# Patient Record
Sex: Female | Born: 1945 | Race: White | Hispanic: No | Smoking: Never smoker
Health system: Southern US, Community
[De-identification: ages and names within clinical notes are randomized; demographics above are authoritative.]

## PROBLEM LIST (undated history)

## (undated) DIAGNOSIS — K219 Gastro-esophageal reflux disease without esophagitis: Secondary | ICD-10-CM

## (undated) DIAGNOSIS — T7840XA Allergy, unspecified, initial encounter: Secondary | ICD-10-CM

## (undated) DIAGNOSIS — R011 Cardiac murmur, unspecified: Secondary | ICD-10-CM

## (undated) DIAGNOSIS — E538 Deficiency of other specified B group vitamins: Secondary | ICD-10-CM

## (undated) DIAGNOSIS — M1990 Unspecified osteoarthritis, unspecified site: Secondary | ICD-10-CM

## (undated) DIAGNOSIS — T4145XA Adverse effect of unspecified anesthetic, initial encounter: Secondary | ICD-10-CM

## (undated) DIAGNOSIS — F419 Anxiety disorder, unspecified: Secondary | ICD-10-CM

## (undated) DIAGNOSIS — M109 Gout, unspecified: Secondary | ICD-10-CM

## (undated) DIAGNOSIS — M545 Low back pain, unspecified: Secondary | ICD-10-CM

## (undated) DIAGNOSIS — T8859XA Other complications of anesthesia, initial encounter: Secondary | ICD-10-CM

## (undated) DIAGNOSIS — G8929 Other chronic pain: Secondary | ICD-10-CM

## (undated) DIAGNOSIS — I10 Essential (primary) hypertension: Secondary | ICD-10-CM

## (undated) DIAGNOSIS — N189 Chronic kidney disease, unspecified: Secondary | ICD-10-CM

## (undated) HISTORY — PX: APPENDECTOMY: SHX54

## (undated) HISTORY — PX: COLONOSCOPY: SHX174

---
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
## 1997-11-07 ENCOUNTER — Other Ambulatory Visit: Admission: RE | Admit: 1997-11-07 | Discharge: 1997-11-07 | Payer: Self-pay | Admitting: Gynecology

## 1998-12-01 ENCOUNTER — Other Ambulatory Visit: Admission: RE | Admit: 1998-12-01 | Discharge: 1998-12-01 | Payer: Self-pay | Admitting: Gynecology

## 1999-05-01 ENCOUNTER — Encounter: Admission: RE | Admit: 1999-05-01 | Discharge: 1999-05-01 | Payer: Self-pay | Admitting: Gynecology

## 1999-05-01 ENCOUNTER — Encounter: Payer: Self-pay | Admitting: Gynecology

## 1999-11-06 ENCOUNTER — Other Ambulatory Visit: Admission: RE | Admit: 1999-11-06 | Discharge: 1999-11-06 | Payer: Self-pay | Admitting: Gynecology

## 1999-11-08 ENCOUNTER — Other Ambulatory Visit: Admission: RE | Admit: 1999-11-08 | Discharge: 1999-11-08 | Payer: Self-pay | Admitting: Gynecology

## 2000-03-06 ENCOUNTER — Ambulatory Visit (HOSPITAL_COMMUNITY): Admission: RE | Admit: 2000-03-06 | Discharge: 2000-03-06 | Payer: Self-pay | Admitting: Gastroenterology

## 2000-03-07 ENCOUNTER — Emergency Department (HOSPITAL_COMMUNITY): Admission: EM | Admit: 2000-03-07 | Discharge: 2000-03-07 | Payer: Self-pay | Admitting: Emergency Medicine

## 2000-03-07 ENCOUNTER — Encounter: Payer: Self-pay | Admitting: Emergency Medicine

## 2000-11-04 ENCOUNTER — Encounter: Admission: RE | Admit: 2000-11-04 | Discharge: 2000-11-04 | Payer: Self-pay | Admitting: Family Medicine

## 2000-11-04 ENCOUNTER — Encounter: Payer: Self-pay | Admitting: Family Medicine

## 2000-11-11 ENCOUNTER — Other Ambulatory Visit: Admission: RE | Admit: 2000-11-11 | Discharge: 2000-11-11 | Payer: Self-pay | Admitting: Urology

## 2001-11-05 ENCOUNTER — Encounter: Admission: RE | Admit: 2001-11-05 | Discharge: 2001-11-05 | Payer: Self-pay | Admitting: Family Medicine

## 2001-11-05 ENCOUNTER — Encounter: Payer: Self-pay | Admitting: Family Medicine

## 2002-11-15 ENCOUNTER — Encounter: Payer: Self-pay | Admitting: Family Medicine

## 2002-11-15 ENCOUNTER — Encounter: Admission: RE | Admit: 2002-11-15 | Discharge: 2002-11-15 | Payer: Self-pay | Admitting: Family Medicine

## 2003-05-18 ENCOUNTER — Other Ambulatory Visit: Admission: RE | Admit: 2003-05-18 | Discharge: 2003-05-18 | Payer: Self-pay | Admitting: Family Medicine

## 2003-06-23 ENCOUNTER — Encounter: Admission: RE | Admit: 2003-06-23 | Discharge: 2003-06-23 | Payer: Self-pay | Admitting: Family Medicine

## 2003-11-18 ENCOUNTER — Encounter: Admission: RE | Admit: 2003-11-18 | Discharge: 2003-11-18 | Payer: Self-pay | Admitting: Family Medicine

## 2004-05-18 ENCOUNTER — Other Ambulatory Visit: Admission: RE | Admit: 2004-05-18 | Discharge: 2004-05-18 | Payer: Self-pay | Admitting: Family Medicine

## 2004-11-30 ENCOUNTER — Encounter: Admission: RE | Admit: 2004-11-30 | Discharge: 2004-11-30 | Payer: Self-pay | Admitting: Family Medicine

## 2005-05-20 ENCOUNTER — Other Ambulatory Visit: Admission: RE | Admit: 2005-05-20 | Discharge: 2005-05-20 | Payer: Self-pay | Admitting: Family Medicine

## 2005-12-02 ENCOUNTER — Encounter: Admission: RE | Admit: 2005-12-02 | Discharge: 2005-12-02 | Payer: Self-pay | Admitting: Family Medicine

## 2006-08-18 ENCOUNTER — Other Ambulatory Visit: Admission: RE | Admit: 2006-08-18 | Discharge: 2006-08-18 | Payer: Self-pay | Admitting: Family Medicine

## 2006-12-04 ENCOUNTER — Encounter: Admission: RE | Admit: 2006-12-04 | Discharge: 2006-12-04 | Payer: Self-pay | Admitting: Family Medicine

## 2007-08-20 ENCOUNTER — Other Ambulatory Visit: Admission: RE | Admit: 2007-08-20 | Discharge: 2007-08-20 | Payer: Self-pay | Admitting: Family Medicine

## 2007-12-07 ENCOUNTER — Encounter: Admission: RE | Admit: 2007-12-07 | Discharge: 2007-12-07 | Payer: Self-pay | Admitting: Family Medicine

## 2010-09-14 NOTE — Op Note (Signed)
[facility]  Patient:    Tricia Price                          MRN: [id number] Proc. Date: 03/06/00 Adm. Date:  [id number] Attending:  Orland Mustard CC:         Tera Mater. Evlyn Kanner, M.D.  Leatha Gilding. Mezer, M.D.   Operative Report  PROCEDURE:  Colonoscopy.  GASTROENTEROLOGIST:  Llana Aliment. Edwards, M.D.  MEDICATIONS:  Fentanyl 100 mcg, Versed 7 mg IV.  INDICATIONS:  Iron-deficiency anemia.  INSTRUMENT:  Pediatric Olympus video colonoscopy.  DESCRIPTION OF PROCEDURE:  The procedure had been explained to the patient and consent obtained.  With the patient in the left lateral decubitus position, the Olympus pediatric video colonoscope was inserted following digital rectal exam and advanced under direct visualization.  The prep was excellent, and we were able to advance to the cecum identified by identification of the ileocecal valve and appendiceal orifice.  The scope with withdrawn. The cecum, ascending colon, hepatic flexure, transverse colon, splenic flexure, descending and sigmoid were seen well upon removal.  No polyps and no significant diverticular disease, and no other significant findings.  The scope was withdrawn.  The patient tolerated the procedure well.  She was maintained on low-flow oxygen on pulse oximetry throughout the procedure with no obvious problem.  ASSESSMENT: Iron-deficiency anemia without obvious cause on colonoscopy.  PLAN:  Will continue iron replacement and see her back in our office in six to eight weeks and recheck her stool. DD:  03/06/00 TD:  03/06/00 Job: [id number]

## 2010-10-15 ENCOUNTER — Ambulatory Visit: Payer: Self-pay | Admitting: Family Medicine

## 2013-12-17 ENCOUNTER — Ambulatory Visit: Payer: Self-pay | Admitting: Physician Assistant

## 2017-11-28 ENCOUNTER — Ambulatory Visit
Admission: RE | Admit: 2017-11-28 | Discharge: 2017-11-28 | Disposition: A | Discharge status: Home / Self Care | Payer: Medicare Other | Source: Ambulatory Visit | Attending: Physician Assistant | Admitting: Physician Assistant

## 2017-11-28 ENCOUNTER — Other Ambulatory Visit (INDEPENDENT_AMBULATORY_CARE_PROVIDER_SITE_OTHER): Payer: Self-pay | Admitting: Physician Assistant

## 2017-11-28 DIAGNOSIS — M545 Low back pain: Secondary | ICD-10-CM

## 2017-11-28 DIAGNOSIS — M25562 Pain in left knee: Secondary | ICD-10-CM

## 2019-06-08 ENCOUNTER — Encounter: Payer: Self-pay | Admitting: Ophthalmology

## 2019-06-10 ENCOUNTER — Other Ambulatory Visit: Payer: Self-pay

## 2019-06-10 ENCOUNTER — Other Ambulatory Visit
Admission: RE | Admit: 2019-06-10 | Discharge: 2019-06-10 | Disposition: A | Discharge status: Home / Self Care | Payer: Medicare Other | Source: Ambulatory Visit | Attending: Ophthalmology | Admitting: Ophthalmology

## 2019-06-10 DIAGNOSIS — Z20822 Contact with and (suspected) exposure to covid-19: Secondary | ICD-10-CM | POA: Insufficient documentation

## 2019-06-10 DIAGNOSIS — Z01812 Encounter for preprocedural laboratory examination: Secondary | ICD-10-CM | POA: Insufficient documentation

## 2019-06-10 LAB — SARS CORONAVIRUS 2 (TAT 6-24 HRS): SARS Coronavirus 2: NEGATIVE

## 2019-06-10 NOTE — Discharge Instructions (Signed)

## 2019-06-14 ENCOUNTER — Ambulatory Visit: Payer: Medicare Other | Admitting: Anesthesiology

## 2019-06-14 ENCOUNTER — Other Ambulatory Visit: Payer: Self-pay

## 2019-06-14 ENCOUNTER — Ambulatory Visit
Admission: RE | Admit: 2019-06-14 | Discharge: 2019-06-14 | Disposition: A | Discharge status: Home / Self Care | Payer: Medicare Other | Source: Ambulatory Visit | Attending: Ophthalmology | Admitting: Ophthalmology

## 2019-06-14 ENCOUNTER — Encounter: Payer: Self-pay | Admitting: Ophthalmology

## 2019-06-14 ENCOUNTER — Encounter
Admission: RE | Disposition: A | Discharge status: Home / Self Care | Payer: Self-pay | Source: Ambulatory Visit | Attending: Ophthalmology

## 2019-06-14 DIAGNOSIS — I10 Essential (primary) hypertension: Secondary | ICD-10-CM | POA: Diagnosis not present

## 2019-06-14 DIAGNOSIS — Z79899 Other long term (current) drug therapy: Secondary | ICD-10-CM | POA: Insufficient documentation

## 2019-06-14 DIAGNOSIS — H2512 Age-related nuclear cataract, left eye: Secondary | ICD-10-CM | POA: Insufficient documentation

## 2019-06-14 HISTORY — DX: Other complications of anesthesia, initial encounter: T88.59XA

## 2019-06-14 HISTORY — PX: CATARACT EXTRACTION W/PHACO: SHX586

## 2019-06-14 HISTORY — DX: Cardiac murmur, unspecified: R01.1

## 2019-06-14 HISTORY — DX: Allergy, unspecified, initial encounter: T78.40XA

## 2019-06-14 HISTORY — DX: Essential (primary) hypertension: I10

## 2019-06-14 HISTORY — DX: Chronic kidney disease, unspecified: N18.9

## 2019-06-14 HISTORY — DX: Unspecified osteoarthritis, unspecified site: M19.90

## 2019-06-14 HISTORY — DX: Gastro-esophageal reflux disease without esophagitis: K21.9

## 2019-06-14 SURGERY — PHACOEMULSIFICATION, CATARACT, WITH IOL INSERTION
Anesthesia: General | Site: Eye | Laterality: Left

## 2019-06-14 MED ORDER — ACETAMINOPHEN 325 MG PO TABS: 325.0000 mg | ORAL_TABLET | ORAL | Status: DC | PRN

## 2019-06-14 MED ORDER — EPINEPHRINE PF 1 MG/ML IJ SOLN
INTRAOCULAR | Status: DC | PRN
  Administered 2019-06-14: 95 mL via OPHTHALMIC

## 2019-06-14 MED ORDER — LIDOCAINE HCL (PF) 2 % IJ SOLN
INTRAOCULAR | Status: DC | PRN
  Administered 2019-06-14: 1 mL via INTRAOCULAR

## 2019-06-14 MED ORDER — SODIUM HYALURONATE 10 MG/ML IO SOLN
INTRAOCULAR | Status: DC | PRN
  Administered 2019-06-14: 0.55 mL via INTRAOCULAR

## 2019-06-14 MED ORDER — MOXIFLOXACIN HCL 0.5 % OP SOLN
OPHTHALMIC | Status: DC | PRN
  Administered 2019-06-14: 0.2 mL via OPHTHALMIC

## 2019-06-14 MED ORDER — FENTANYL CITRATE (PF) 100 MCG/2ML IJ SOLN
INTRAMUSCULAR | Status: DC | PRN
  Administered 2019-06-14 (×2): 50 ug via INTRAVENOUS

## 2019-06-14 MED ORDER — SODIUM HYALURONATE 23 MG/ML IO SOLN
INTRAOCULAR | Status: DC | PRN
  Administered 2019-06-14: 0.6 mL via INTRAOCULAR

## 2019-06-14 MED ORDER — ACETAMINOPHEN 160 MG/5ML PO SOLN: 325.0000 mg | ORAL | Status: DC | PRN

## 2019-06-14 MED ORDER — TETRACAINE HCL 0.5 % OP SOLN
1.0000 [drp] | OPHTHALMIC | Status: DC | PRN
  Administered 2019-06-14 (×3): 1 [drp] via OPHTHALMIC

## 2019-06-14 MED ORDER — ARMC OPHTHALMIC DILATING DROPS
1.0000 "application " | OPHTHALMIC | Status: DC | PRN
  Administered 2019-06-14 (×3): 1 via OPHTHALMIC

## 2019-06-14 MED ORDER — MIDAZOLAM HCL 2 MG/2ML IJ SOLN
INTRAMUSCULAR | Status: DC | PRN
  Administered 2019-06-14: 2 mg via INTRAVENOUS

## 2019-06-14 SURGICAL SUPPLY — 19 items
CANNULA ANT/CHMB 27G (MISCELLANEOUS) ×2 IMPLANT
CANNULA ANT/CHMB 27GA (MISCELLANEOUS) ×6 IMPLANT
DISSECTOR HYDRO NUCLEUS 50X22 (MISCELLANEOUS) ×3 IMPLANT
GLOVE SURG LX 7.5 STRW (GLOVE) ×4
GLOVE SURG LX STRL 7.5 STRW (GLOVE) ×1 IMPLANT
GLOVE SURG SYN 8.5  E (GLOVE) ×2
GLOVE SURG SYN 8.5 E (GLOVE) ×1 IMPLANT
GLOVE SURG SYN 8.5 PF PI (GLOVE) ×1 IMPLANT
GOWN STRL REUS W/ TWL LRG LVL3 (GOWN DISPOSABLE) ×2 IMPLANT
GOWN STRL REUS W/TWL LRG LVL3 (GOWN DISPOSABLE) ×4
LENS IOL TECNIS ITEC 19.0 (Intraocular Lens) ×2 IMPLANT
MARKER SKIN DUAL TIP RULER LAB (MISCELLANEOUS) ×3 IMPLANT
PACK DR. KING ARMS (PACKS) ×3 IMPLANT
PACK EYE AFTER SURG (MISCELLANEOUS) ×3 IMPLANT
PACK OPTHALMIC (MISCELLANEOUS) ×3 IMPLANT
SYR 3ML LL SCALE MARK (SYRINGE) ×3 IMPLANT
SYR TB 1ML LUER SLIP (SYRINGE) ×3 IMPLANT
WATER STERILE IRR 250ML POUR (IV SOLUTION) ×3 IMPLANT
WIPE NON LINTING 3.25X3.25 (MISCELLANEOUS) ×3 IMPLANT

## 2019-06-14 NOTE — Transfer of Care (Signed)
Immediate Anesthesia Transfer of Care Note  Patient: Tricia Price  Procedure(s) Performed: CATARACT EXTRACTION PHACO AND INTRAOCULAR LENS PLACEMENT (IOC) LEFT (Left Eye)  Patient Location: PACU  Anesthesia Type: General  Level of Consciousness: awake, alert  and patient cooperative  Airway and Oxygen Therapy: Patient Spontanous Breathing and Patient connected to supplemental oxygen  Post-op Assessment: Post-op Vital signs reviewed, Patient's Cardiovascular Status Stable, Respiratory Function Stable, Patent Airway and No signs of Nausea or vomiting  Post-op Vital Signs: Reviewed and stable  Complications: No apparent anesthesia complications

## 2019-06-14 NOTE — Anesthesia Procedure Notes (Signed)
Procedure Name: MAC Date/Time: 06/14/2019 10:23 AM Performed by: Jeannene Patella, CRNA Pre-anesthesia Checklist: Patient identified, Emergency Drugs available, Patient being monitored, Timeout performed and Suction available Patient Re-evaluated:Patient Re-evaluated prior to induction Oxygen Delivery Method: Nasal cannula

## 2019-06-14 NOTE — H&P (Signed)

## 2019-06-14 NOTE — Anesthesia Preprocedure Evaluation (Addendum)
Anesthesia Evaluation    Airway Mallampati: II  TM Distance: >3 FB Neck ROM: Full    Dental   Pulmonary    Pulmonary exam normal        Cardiovascular hypertension, Normal cardiovascular exam     Neuro/Psych    GI/Hepatic GERD  ,  Endo/Other    Renal/GU CRFRenal disease     Musculoskeletal  (+) Arthritis ,   Abdominal (+) + obese,   Peds  Hematology   Anesthesia Other Findings   Reproductive/Obstetrics                             Anesthesia Physical Anesthesia Plan  ASA: II  Anesthesia Plan: MAC   Post-op Pain Management:    Induction: Intravenous  PONV Risk Score and Plan:   Airway Management Planned: Natural Airway  Additional Equipment:   Intra-op Plan:   Post-operative Plan:   Informed Consent: I have reviewed the patients History and Physical, chart, labs and discussed the procedure including the risks, benefits and alternatives for the proposed anesthesia with the patient or authorized representative who has indicated his/her understanding and acceptance.       Plan Discussed with: CRNA, Anesthesiologist and Surgeon  Anesthesia Plan Comments:        Anesthesia Quick Evaluation

## 2019-06-14 NOTE — Op Note (Signed)
OPERATIVE NOTE  Tricia Price [id number] 06/14/2019   PREOPERATIVE DIAGNOSIS:  Nuclear sclerotic cataract left eye.  H25.12   POSTOPERATIVE DIAGNOSIS:    Nuclear sclerotic cataract left eye.     PROCEDURE:  Phacoemusification with posterior chamber intraocular lens placement of the left eye   LENS:   Implant Name Type Inv. Item Serial No. Manufacturer Lot No. LRB No. Used Action  LENS IOL DIOP 19.0 - [id number] Intraocular Lens LENS IOL DIOP 19.0 [id number] AMO  Left 1 Implanted      Procedure(s) with comments: CATARACT EXTRACTION PHACO AND INTRAOCULAR LENS PLACEMENT (IOC) LEFT (Left) - CDE 6.06 [place] 0:48.5  PCB00   ULTRASOUND TIME: 0 minutes 48 seconds.  CDE 6.06   SURGEON:  Willey Blade, MD, MPH   ANESTHESIA:  Topical with tetracaine drops augmented with 1% preservative-free intracameral lidocaine.  ESTIMATED BLOOD LOSS: <1 mL   COMPLICATIONS:  None.   DESCRIPTION OF PROCEDURE:  The patient was identified in the holding room and transported to the operating room and placed in the supine position under the operating microscope.  The left eye was identified as the operative eye and it was prepped and draped in the usual sterile ophthalmic fashion.   A 1.0 millimeter clear-corneal paracentesis was made at the 5:00 position. 0.5 ml of preservative-free 1% lidocaine with epinephrine was injected into the anterior chamber.  The anterior chamber was filled with Healon 5 viscoelastic.  A 2.4 millimeter keratome was used to make a near-clear corneal incision at the 2:00 position.  A curvilinear capsulorrhexis was made with a cystotome and capsulorrhexis forceps.  Balanced salt solution was used to hydrodissect and hydrodelineate the nucleus.   Phacoemulsification was then used in stop and chop fashion to remove the lens nucleus and epinucleus.  The remaining cortex was then removed using the irrigation and aspiration handpiece. Healon was then placed into the capsular bag to  distend it for lens placement.  A lens was then injected into the capsular bag.  The remaining viscoelastic was aspirated.   Wounds were hydrated with balanced salt solution.  The anterior chamber was inflated to a physiologic pressure with balanced salt solution.  Intracameral vigamox 0.1 mL undiltued was injected into the eye and a drop placed onto the ocular surface.  No wound leaks were noted.  The patient was taken to the recovery room in stable condition without complications of anesthesia or surgery  Willey Blade 06/14/2019, 10:43 AM

## 2019-06-14 NOTE — Anesthesia Postprocedure Evaluation (Signed)
Anesthesia Post Note  Patient: Tricia Price  Procedure(s) Performed: CATARACT EXTRACTION PHACO AND INTRAOCULAR LENS PLACEMENT (IOC) LEFT (Left Eye)     Patient location during evaluation: PACU Anesthesia Type: General Level of consciousness: awake and alert Pain management: pain level controlled Vital Signs Assessment: post-procedure vital signs reviewed and stable Respiratory status: spontaneous breathing, nonlabored ventilation, respiratory function stable and patient connected to nasal cannula oxygen Cardiovascular status: stable and blood pressure returned to baseline Postop Assessment: no apparent nausea or vomiting Anesthetic complications: no    Gayland Curry Andruw Battie

## 2019-06-15 ENCOUNTER — Encounter: Payer: Self-pay | Admitting: *Deleted

## 2019-08-05 ENCOUNTER — Other Ambulatory Visit: Payer: Self-pay | Admitting: Physical Medicine & Rehabilitation

## 2019-08-05 DIAGNOSIS — M5416 Radiculopathy, lumbar region: Secondary | ICD-10-CM

## 2019-08-19 ENCOUNTER — Ambulatory Visit
Admission: RE | Admit: 2019-08-19 | Discharge: 2019-08-19 | Disposition: A | Discharge status: Home / Self Care | Payer: Medicare Other | Source: Ambulatory Visit | Attending: Physical Medicine & Rehabilitation | Admitting: Physical Medicine & Rehabilitation

## 2019-08-19 ENCOUNTER — Other Ambulatory Visit: Payer: Self-pay

## 2019-08-19 DIAGNOSIS — M5416 Radiculopathy, lumbar region: Secondary | ICD-10-CM | POA: Diagnosis not present

## 2020-05-18 DIAGNOSIS — S065XAA Traumatic subdural hemorrhage with loss of consciousness status unknown, initial encounter: Secondary | ICD-10-CM

## 2020-05-18 HISTORY — DX: Traumatic subdural hemorrhage with loss of consciousness status unknown, initial encounter: S06.5XAA

## 2021-05-21 ENCOUNTER — Ambulatory Visit
Admission: RE | Admit: 2021-05-21 | Discharge: 2021-05-21 | Disposition: A | Discharge status: Home / Self Care | Payer: Medicare Other | Source: Ambulatory Visit | Attending: Family Medicine | Admitting: Family Medicine

## 2021-05-21 ENCOUNTER — Other Ambulatory Visit: Payer: Self-pay | Admitting: Family Medicine

## 2021-05-21 ENCOUNTER — Other Ambulatory Visit: Payer: Self-pay

## 2021-05-21 DIAGNOSIS — R2242 Localized swelling, mass and lump, left lower limb: Secondary | ICD-10-CM | POA: Diagnosis present

## 2021-05-21 DIAGNOSIS — M79605 Pain in left leg: Secondary | ICD-10-CM

## 2021-09-04 ENCOUNTER — Encounter: Payer: Self-pay | Admitting: Ophthalmology

## 2021-09-06 NOTE — Discharge Instructions (Signed)

## 2021-09-10 ENCOUNTER — Ambulatory Visit
Admission: RE | Admit: 2021-09-10 | Discharge: 2021-09-10 | Disposition: A | Discharge status: Home / Self Care | Payer: Medicare Other | Source: Ambulatory Visit | Attending: Ophthalmology | Admitting: Ophthalmology

## 2021-09-10 ENCOUNTER — Other Ambulatory Visit: Payer: Self-pay

## 2021-09-10 ENCOUNTER — Encounter: Payer: Self-pay | Admitting: Ophthalmology

## 2021-09-10 ENCOUNTER — Encounter
Admission: RE | Disposition: A | Discharge status: Home / Self Care | Payer: Self-pay | Source: Ambulatory Visit | Attending: Ophthalmology

## 2021-09-10 ENCOUNTER — Ambulatory Visit: Payer: Medicare Other | Admitting: Anesthesiology

## 2021-09-10 DIAGNOSIS — H2511 Age-related nuclear cataract, right eye: Secondary | ICD-10-CM | POA: Diagnosis present

## 2021-09-10 DIAGNOSIS — I129 Hypertensive chronic kidney disease with stage 1 through stage 4 chronic kidney disease, or unspecified chronic kidney disease: Secondary | ICD-10-CM | POA: Insufficient documentation

## 2021-09-10 DIAGNOSIS — E669 Obesity, unspecified: Secondary | ICD-10-CM | POA: Diagnosis not present

## 2021-09-10 DIAGNOSIS — N189 Chronic kidney disease, unspecified: Secondary | ICD-10-CM | POA: Diagnosis not present

## 2021-09-10 DIAGNOSIS — Z6830 Body mass index (BMI) 30.0-30.9, adult: Secondary | ICD-10-CM | POA: Diagnosis not present

## 2021-09-10 DIAGNOSIS — Z01818 Encounter for other preprocedural examination: Secondary | ICD-10-CM

## 2021-09-10 HISTORY — PX: CATARACT EXTRACTION W/PHACO: SHX586

## 2021-09-10 HISTORY — DX: Low back pain, unspecified: M54.50

## 2021-09-10 HISTORY — DX: Other chronic pain: G89.29

## 2021-09-10 SURGERY — PHACOEMULSIFICATION, CATARACT, WITH IOL INSERTION
Anesthesia: Monitor Anesthesia Care | Site: Eye | Laterality: Right

## 2021-09-10 MED ORDER — TETRACAINE HCL 0.5 % OP SOLN
1.0000 [drp] | OPHTHALMIC | Status: DC | PRN
  Administered 2021-09-10 (×3): 1 [drp] via OPHTHALMIC

## 2021-09-10 MED ORDER — SIGHTPATH DOSE#1 SODIUM HYALURONATE 23 MG/ML IO SOLUTION
PREFILLED_SYRINGE | INTRAOCULAR | Status: DC | PRN
  Administered 2021-09-10: 0.6 mL via INTRAOCULAR

## 2021-09-10 MED ORDER — SIGHTPATH DOSE#1 SODIUM HYALURONATE 10 MG/ML IO SOLUTION
PREFILLED_SYRINGE | INTRAOCULAR | Status: DC | PRN
  Administered 2021-09-10: 0.85 mL via INTRAOCULAR

## 2021-09-10 MED ORDER — LACTATED RINGERS IV SOLN: INTRAVENOUS | Status: DC

## 2021-09-10 MED ORDER — SIGHTPATH DOSE#1 BSS IO SOLN
INTRAOCULAR | Status: DC | PRN
  Administered 2021-09-10: 15 mL

## 2021-09-10 MED ORDER — ACETAMINOPHEN 160 MG/5ML PO SOLN: 325.0000 mg | ORAL | Status: DC | PRN

## 2021-09-10 MED ORDER — CYCLOPENTOLATE HCL 2 % OP SOLN
1.0000 [drp] | OPHTHALMIC | Status: AC | PRN
  Administered 2021-09-10 (×3): 1 [drp] via OPHTHALMIC

## 2021-09-10 MED ORDER — LIDOCAINE HCL (PF) 2 % IJ SOLN
INTRAMUSCULAR | Status: DC | PRN
  Administered 2021-09-10: 1 mL via INTRAOCULAR

## 2021-09-10 MED ORDER — MIDAZOLAM HCL 2 MG/2ML IJ SOLN
INTRAMUSCULAR | Status: DC | PRN
  Administered 2021-09-10 (×2): 1 mg via INTRAVENOUS

## 2021-09-10 MED ORDER — ACETAMINOPHEN 325 MG PO TABS: 650.0000 mg | ORAL_TABLET | ORAL | Status: DC | PRN

## 2021-09-10 MED ORDER — ONDANSETRON HCL 4 MG/2ML IJ SOLN: 4.0000 mg | Freq: Once | INTRAMUSCULAR | Status: DC | PRN

## 2021-09-10 MED ORDER — FENTANYL CITRATE (PF) 100 MCG/2ML IJ SOLN
INTRAMUSCULAR | Status: DC | PRN
  Administered 2021-09-10: 50 ug via INTRAVENOUS

## 2021-09-10 MED ORDER — PHENYLEPHRINE HCL 10 % OP SOLN
1.0000 [drp] | OPHTHALMIC | Status: AC | PRN
  Administered 2021-09-10 (×3): 1 [drp] via OPHTHALMIC

## 2021-09-10 MED ORDER — SIGHTPATH DOSE#1 BSS IO SOLN
INTRAOCULAR | Status: DC | PRN
  Administered 2021-09-10: 92 mL via OPHTHALMIC

## 2021-09-10 MED ORDER — MOXIFLOXACIN HCL 0.5 % OP SOLN
OPHTHALMIC | Status: DC | PRN
  Administered 2021-09-10: 0.2 mL via OPHTHALMIC

## 2021-09-10 SURGICAL SUPPLY — 14 items
CATARACT SUITE SIGHTPATH (MISCELLANEOUS) ×2 IMPLANT
DISSECTOR HYDRO NUCLEUS 50X22 (MISCELLANEOUS) ×2 IMPLANT
FEE CATARACT SUITE SIGHTPATH (MISCELLANEOUS) ×1 IMPLANT
GLOVE SURG GAMMEX PI TX LF 7.5 (GLOVE) ×2 IMPLANT
GLOVE SURG SYN 8.5  E (GLOVE) ×2
GLOVE SURG SYN 8.5 E (GLOVE) ×1 IMPLANT
GLOVE SURG SYN 8.5 PF PI (GLOVE) ×1 IMPLANT
LENS IOL TECNIS EYHANCE 18.0 (Intraocular Lens) ×1 IMPLANT
NDL FILTER BLUNT 18X1 1/2 (NEEDLE) ×1 IMPLANT
NEEDLE FILTER BLUNT 18X 1/2SAF (NEEDLE) ×1
NEEDLE FILTER BLUNT 18X1 1/2 (NEEDLE) ×1 IMPLANT
SYR 3ML LL SCALE MARK (SYRINGE) ×2 IMPLANT
SYR 5ML LL (SYRINGE) ×2 IMPLANT
WATER STERILE IRR 250ML POUR (IV SOLUTION) ×2 IMPLANT

## 2021-09-10 NOTE — Anesthesia Preprocedure Evaluation (Signed)
Anesthesia Evaluation  ?Patient identified by MRN, date of birth, ID band ?Patient awake ? ? ? ?Reviewed: ?Allergy & Precautions, NPO status , Patient's Chart, lab work & pertinent test results, reviewed documented beta blocker date and time  ? ?History of Anesthesia Complications ?Negative for: history of anesthetic complications ? ?Airway ?Mallampati: II ? ?TM Distance: >3 FB ?Neck ROM: Limited ? ? ? Dental ?  ?Pulmonary ? ?  ?breath sounds clear to auscultation ? ? ? ? ? ? Cardiovascular ?hypertension, (-) angina+ DOE (Chronic, stable)  ? ?Rhythm:Regular Rate:Normal ? ? ?  ?Neuro/Psych ? ?H/o subdural hematoma 04/2020 ?  ? GI/Hepatic ?GERD  ,  ?Endo/Other  ? ? Renal/GU ?CRFRenal disease  ? ?  ?Musculoskeletal ? ?(+) Arthritis ,  ? Abdominal ?(+) + obese (BMI 31),   ?Peds ? Hematology ?  ?Anesthesia Other Findings ? ? Reproductive/Obstetrics ? ?  ? ? ? ? ? ? ? ? ? ? ? ? ? ?  ?  ? ? ? ? ? ? ? ? ?Anesthesia Physical ?Anesthesia Plan ? ?ASA: 2 ? ?Anesthesia Plan: MAC  ? ?Post-op Pain Management:   ? ?Induction: Intravenous ? ?PONV Risk Score and Plan: TIVA, Midazolam and Treatment may vary due to age or medical condition ? ?Airway Management Planned: Nasal Cannula ? ?Additional Equipment:  ? ?Intra-op Plan:  ? ?Post-operative Plan:  ? ?Informed Consent: I have reviewed the patients History and Physical, chart, labs and discussed the procedure including the risks, benefits and alternatives for the proposed anesthesia with the patient or authorized representative who has indicated his/her understanding and acceptance.  ? ? ? ? ? ?Plan Discussed with: CRNA and Anesthesiologist ? ?Anesthesia Plan Comments:   ? ? ? ? ? ? ?Anesthesia Quick Evaluation ? ?

## 2021-09-10 NOTE — Op Note (Signed)
OPERATIVE NOTE ? ?Tricia Price ?[id number] ?09/10/2021 ? ? ?PREOPERATIVE DIAGNOSIS:  Nuclear sclerotic cataract right eye.  H25.11 ?  ?POSTOPERATIVE DIAGNOSIS:    Nuclear sclerotic cataract right eye.   ?  ?PROCEDURE:  Phacoemusification with posterior chamber intraocular lens placement of the right eye  ? ?LENS:   ?Implant Name Type Inv. Item Serial No. Manufacturer Lot No. LRB No. Used Action  ?LENS IOL TECNIS EYHANCE 18.0 - [id number] Intraocular Lens LENS IOL TECNIS EYHANCE 18.0 [id number] [employer]  Right 1 Implanted  ?    ? ?Procedure(s): ?CATARACT EXTRACTION PHACO AND INTRAOCULAR LENS PLACEMENT (IOC) RIGHT 6.21 00:44.1 (Right) ? ?DIB00 +18.0 ?  ?ULTRASOUND TIME: 0 minutes 44 seconds.  CDE 6.21 ?  ?SURGEON:  Willey Blade, MD, MPH ? ?ANESTHESIOLOGIST: Anesthesiologist: Heniser, Burman Foster, MD ?CRNA: Maree Krabbe, CRNA ?  ?ANESTHESIA:  Topical with tetracaine drops augmented with 1% preservative-free intracameral lidocaine. ? ?ESTIMATED BLOOD LOSS: less than 1 mL. ?  ?COMPLICATIONS:  None. ?  ?DESCRIPTION OF PROCEDURE:  The patient was identified in the holding room and transported to the operating room and placed in the supine position under the operating microscope.  The right eye was identified as the operative eye and it was prepped and draped in the usual sterile ophthalmic fashion. ?  ?A 1.0 millimeter clear-corneal paracentesis was made at the 10:30 position. 0.5 ml of preservative-free 1% lidocaine with epinephrine was injected into the anterior chamber. ? The anterior chamber was filled with Healon 5 viscoelastic.  A 2.4 millimeter keratome was used to make a near-clear corneal incision at the 8:00 position.  A curvilinear capsulorrhexis was made with a cystotome and capsulorrhexis forceps.  Balanced salt solution was used to hydrodissect and hydrodelineate the nucleus. ?  ?Phacoemulsification was then used in stop and chop fashion to remove the lens nucleus and epinucleus.  The remaining cortex was  then removed using the irrigation and aspiration handpiece. Healon was then placed into the capsular bag to distend it for lens placement.  A lens was then injected into the capsular bag.  The remaining viscoelastic was aspirated. ?  ?Wounds were hydrated with balanced salt solution.  The anterior chamber was inflated to a physiologic pressure with balanced salt solution.  ? ?Intracameral vigamox 0.1 mL undiluted was injected into the eye and a drop placed onto the ocular surface. ? ?No wound leaks were noted.  The patient was taken to the recovery room in stable condition without complications of anesthesia or surgery ? ?Willey Blade ?09/10/2021, 9:24 AM ? ?

## 2021-09-10 NOTE — Anesthesia Procedure Notes (Signed)
Date/Time: 09/10/2021 9:08 AM ?Performed by: Maree Krabbe, CRNA ?Pre-anesthesia Checklist: Patient identified, Emergency Drugs available, Suction available, Timeout performed and Patient being monitored ?Patient Re-evaluated:Patient Re-evaluated prior to induction ?Oxygen Delivery Method: Nasal cannula ?Placement Confirmation: positive ETCO2 ? ? ? ? ?

## 2021-09-10 NOTE — H&P (Signed)
Tricia Price  ? ?Primary Care Physician:  Sallee Lange, NP ?Ophthalmologist: Dr. Benay Pillow ? ?Pre-Procedure History & Physical: ?HPI:  Alexanna Waddel is a 76 y.o. female here for cataract surgery. ?  ?Past Medical History:  ?Diagnosis Date  ? Allergies   ? Arthritis   ? Chronic kidney disease   ? followed due to low function  ? Chronic low back pain   ? Complication of anesthesia   ? slow to wake  ? GERD (gastroesophageal reflux disease)   ? Heart murmur   ? Hypertension   ? Subdural hematoma ([place]) 05/18/2020  ? After fall.  Resolved  ? ? ?Past Surgical History:  ?Procedure Laterality Date  ? APPENDECTOMY    ? CATARACT EXTRACTION W/PHACO Left 06/14/2019  ? Procedure: CATARACT EXTRACTION PHACO AND INTRAOCULAR LENS PLACEMENT ([place]) LEFT;  Surgeon: Eulogio Bear, MD;  Location: [facility];  Service: Ophthalmology;  Laterality: Left;  CDE 6.06 ?[place] 0:48.5  ? COLONOSCOPY    ? ? ?Prior to Admission medications   ?Medication Sig Start Date End Date Taking? Authorizing Provider  ?acetaminophen (TYLENOL) 650 MG CR tablet Take 650 mg by mouth every 8 (eight) hours as needed for pain.   Yes [provider]  ?AZO-CRANBERRY PO Take by mouth at bedtime.   Yes [provider]  ?benazepril (LOTENSIN) 20 MG tablet Take 20 mg by mouth 2 (two) times daily.   Yes [provider]  ?Cholecalciferol (VITAMIN D3) 10 MCG (400 UNIT) CAPS Take 50 mcg by mouth daily.   Yes [provider]  ?escitalopram (LEXAPRO) 20 MG tablet Take 20 mg by mouth daily.   Yes [provider]  ?fluticasone (FLONASE) 50 MCG/ACT nasal spray Place 2 sprays into both nostrils daily.   Yes [provider]  ?Multiple Vitamin (MULTIVITAMIN) tablet Take 1 tablet by mouth daily. Balance of Nature Fruit Capsule (3 per day) ?Balance of Nature Veggie Capsule (3 per Day)   Yes [provider]  ?spironolactone (ALDACTONE) 25 MG tablet Take 25 mg by mouth 2 (two) times daily.    Yes [provider]  ?vitamin B-12 (CYANOCOBALAMIN) 1000 MCG tablet Take 5,000 mcg by mouth daily.   Yes [provider]  ? ? ?Allergies as of 08/15/2021  ? (No Known Allergies)  ? ? ?History reviewed. No pertinent family history. ? ?Social History  ? ?Socioeconomic History  ? Marital status: Married  ?  Spouse name: Not on file  ? Number of children: Not on file  ? Years of education: Not on file  ? Highest education level: Not on file  ?Occupational History  ? Not on file  ?Tobacco Use  ? Smoking status: Never  ? Smokeless tobacco: Never  ?Vaping Use  ? Vaping Use: Never used  ?Substance and Sexual Activity  ? Alcohol use: Not Currently  ? Drug use: Not on file  ? Sexual activity: Not on file  ?Other Topics Concern  ? Not on file  ?Social History Narrative  ? Not on file  ? ?Social Determinants of Health  ? ?Financial Resource Strain: Not on file  ?Food Insecurity: Not on file  ?Transportation Needs: Not on file  ?Physical Activity: Not on file  ?Stress: Not on file  ?Social Connections: Not on file  ?Intimate Partner Violence: Not on file  ? ? ?Review of Systems: ?See HPI, otherwise negative ROS ? ?Physical Exam: ?BP 126/70   Pulse 69   Temp (!) 96.8 ?F (36 ?C) (  Temporal)   Resp 20   Ht 5\' 2"  (1.575 m)   Wt 76.2 kg   SpO2 98%   BMI 30.73 kg/m?  ?General:   Alert, cooperative in NAD ?Head:  Normocephalic and atraumatic. ?Respiratory:  Normal work of breathing. ?Cardiovascular:  RRR ? ?Impression/Plan: ?Eneida Bassi is here for cataract surgery. ? ?Risks, benefits, limitations, and alternatives regarding cataract surgery have been reviewed with the patient.  Questions have been answered.  All parties agreeable. ? ? ?Benay Pillow, MD  09/10/2021, 8:58 AM ? ? ?

## 2021-09-10 NOTE — Anesthesia Postprocedure Evaluation (Signed)
Anesthesia Post Note ? ?Patient: Tricia Price ? ?Procedure(s) Performed: CATARACT EXTRACTION PHACO AND INTRAOCULAR LENS PLACEMENT (IOC) RIGHT 6.21 00:44.1 (Right: Eye) ? ? ?  ?Patient location during evaluation: PACU ?Anesthesia Type: MAC ?Level of consciousness: awake and alert ?Pain management: pain level controlled ?Vital Signs Assessment: post-procedure vital signs reviewed and stable ?Respiratory status: spontaneous breathing, nonlabored ventilation, respiratory function stable and patient connected to nasal cannula oxygen ?Cardiovascular status: stable and blood pressure returned to baseline ?Postop Assessment: no apparent nausea or vomiting ?Anesthetic complications: no ? ? ?No notable events documented. ? ?Maelani Yarbro A  Felise [place] ? ? ? ? ? ?

## 2021-09-10 NOTE — Transfer of Care (Signed)
Immediate Anesthesia Transfer of Care Note ? ?Patient: Tricia Price ? ?Procedure(s) Performed: CATARACT EXTRACTION PHACO AND INTRAOCULAR LENS PLACEMENT (IOC) RIGHT 6.21 00:44.1 (Right: Eye) ? ?Patient Location: PACU ? ?Anesthesia Type: MAC ? ?Level of Consciousness: awake, alert  and patient cooperative ? ?Airway and Oxygen Therapy: Patient Spontanous Breathing and Patient connected to supplemental oxygen ? ?Post-op Assessment: Post-op Vital signs reviewed, Patient's Cardiovascular Status Stable, Respiratory Function Stable, Patent Airway and No signs of Nausea or vomiting ? ?Post-op Vital Signs: Reviewed and stable ? ?Complications: No notable events documented. ? ?

## 2022-02-18 ENCOUNTER — Other Ambulatory Visit: Payer: Self-pay | Admitting: Nurse Practitioner

## 2022-02-18 DIAGNOSIS — Z1231 Encounter for screening mammogram for malignant neoplasm of breast: Secondary | ICD-10-CM

## 2022-03-06 ENCOUNTER — Ambulatory Visit
Admission: RE | Admit: 2022-03-06 | Discharge: 2022-03-06 | Disposition: A | Discharge status: Home / Self Care | Payer: Medicare Other | Source: Ambulatory Visit | Attending: Nurse Practitioner | Admitting: Nurse Practitioner

## 2022-03-06 DIAGNOSIS — Z1231 Encounter for screening mammogram for malignant neoplasm of breast: Secondary | ICD-10-CM | POA: Insufficient documentation

## 2022-03-12 ENCOUNTER — Inpatient Hospital Stay
Admission: RE | Admit: 2022-03-12 | Discharge: 2022-03-12 | Disposition: A | Discharge status: Home / Self Care | Payer: Self-pay | Source: Ambulatory Visit | Attending: *Deleted | Admitting: *Deleted

## 2022-03-12 ENCOUNTER — Other Ambulatory Visit: Payer: Self-pay | Admitting: *Deleted

## 2022-03-12 DIAGNOSIS — Z1231 Encounter for screening mammogram for malignant neoplasm of breast: Secondary | ICD-10-CM

## 2022-06-11 ENCOUNTER — Encounter: Payer: Self-pay | Admitting: Internal Medicine

## 2022-06-12 ENCOUNTER — Ambulatory Visit
Admission: RE | Admit: 2022-06-12 | Discharge: 2022-06-12 | Disposition: A | Discharge status: Home / Self Care | Payer: Medicare Other | Attending: Internal Medicine | Admitting: Internal Medicine

## 2022-06-12 ENCOUNTER — Ambulatory Visit: Payer: Medicare Other | Admitting: Registered Nurse

## 2022-06-12 ENCOUNTER — Encounter: Payer: Self-pay | Admitting: Internal Medicine

## 2022-06-12 ENCOUNTER — Encounter
Admission: RE | Disposition: A | Discharge status: Home / Self Care | Payer: Self-pay | Source: Home / Self Care | Attending: Internal Medicine

## 2022-06-12 ENCOUNTER — Other Ambulatory Visit: Payer: Self-pay

## 2022-06-12 DIAGNOSIS — R195 Other fecal abnormalities: Secondary | ICD-10-CM | POA: Insufficient documentation

## 2022-06-12 DIAGNOSIS — N189 Chronic kidney disease, unspecified: Secondary | ICD-10-CM | POA: Diagnosis not present

## 2022-06-12 DIAGNOSIS — K5730 Diverticulosis of large intestine without perforation or abscess without bleeding: Secondary | ICD-10-CM | POA: Insufficient documentation

## 2022-06-12 DIAGNOSIS — Z1211 Encounter for screening for malignant neoplasm of colon: Secondary | ICD-10-CM | POA: Diagnosis present

## 2022-06-12 DIAGNOSIS — I129 Hypertensive chronic kidney disease with stage 1 through stage 4 chronic kidney disease, or unspecified chronic kidney disease: Secondary | ICD-10-CM | POA: Diagnosis not present

## 2022-06-12 DIAGNOSIS — K640 First degree hemorrhoids: Secondary | ICD-10-CM | POA: Diagnosis not present

## 2022-06-12 HISTORY — PX: COLONOSCOPY WITH PROPOFOL: SHX5780

## 2022-06-12 HISTORY — DX: Deficiency of other specified B group vitamins: E53.8

## 2022-06-12 HISTORY — DX: Gout, unspecified: M10.9

## 2022-06-12 HISTORY — DX: Anxiety disorder, unspecified: F41.9

## 2022-06-12 SURGERY — COLONOSCOPY WITH PROPOFOL
Anesthesia: General

## 2022-06-12 MED ORDER — LIDOCAINE HCL (CARDIAC) PF 100 MG/5ML IV SOSY
PREFILLED_SYRINGE | INTRAVENOUS | Status: DC | PRN
  Administered 2022-06-12: 100 mg via INTRAVENOUS

## 2022-06-12 MED ORDER — PROPOFOL 10 MG/ML IV BOLUS
INTRAVENOUS | Status: DC | PRN
  Administered 2022-06-12: 80 mg via INTRAVENOUS

## 2022-06-12 MED ORDER — PROPOFOL 500 MG/50ML IV EMUL
INTRAVENOUS | Status: DC | PRN
  Administered 2022-06-12: 150 ug/kg/min via INTRAVENOUS

## 2022-06-12 MED ORDER — SODIUM CHLORIDE 0.9 % IV SOLN: INTRAVENOUS | Status: DC

## 2022-06-12 NOTE — Op Note (Signed)
[facility] Gastroenter[facility] Patient Name: Tricia Price Procedure Date: 06/12/2022 11:00 AM MRN: [id number] Account #: [id number] Date of Birth: 1945-12-22 Admit Type: Outpatient Age: 77 Room: [facility] ENDO ROOM 2 Gender: Female Note Status: Finalized Instrument Name: Jasper Riling [id number] Procedure:             Colonoscopy Indications:           Heme positive stool Providers:             Lorie Apley K. Lineth Thielke MD, MD Medicines:             Propofol per Anesthesia Complications:         No immediate complications. Procedure:             Pre-Anesthesia Assessment:                        - The risks and benefits of the procedure and the                         sedation options and risks were discussed with the                         patient. All questions were answered and informed                         consent was obtained.                        - Patient identification and proposed procedure were                         verified prior to the procedure by the nurse. The                         procedure was verified in the procedure room.                        - ASA Grade Assessment: III - A patient with severe                         systemic disease.                        - After reviewing the risks and benefits, the patient                         was deemed in satisfactory condition to undergo the                         procedure.                        After obtaining informed consent, the colonoscope was                         passed under direct vision. Throughout the procedure,                         the patient's blood pressure, pulse, and oxygen  saturations were monitored continuously. The                         Colonoscope was introduced through the anus and                         advanced to the the cecum, identified by appendiceal                         orifice and ileocecal valve. The colonoscopy was                          performed without difficulty. The patient tolerated                         the procedure well. The quality of the bowel                         preparation was good. The ileocecal valve, appendiceal                         orifice, and rectum were photographed. Findings:      The perianal and digital rectal examinations were normal. Pertinent       negatives include normal sphincter tone and no palpable rectal lesions.      Non-bleeding internal hemorrhoids were found during retroflexion. The       hemorrhoids were Grade I (internal hemorrhoids that do not prolapse).      Many small-mouthed diverticula were found in the sigmoid colon.      The exam was otherwise without abnormality. Impression:            - Non-bleeding internal hemorrhoids.                        - Diverticulosis in the sigmoid colon.                        - The examination was otherwise normal.                        - No specimens collected. Recommendation:        - Patient has a contact number available for                         emergencies. The signs and symptoms of potential                         delayed complications were discussed with the patient.                         Return to normal activities tomorrow. Written                         discharge instructions were provided to the patient.                        - High fiber diet.                        - Continue present  medications.                        - NO repeat colonoscopy due to age/comorbid status                        - You do NOT require further colon cancer screening                         measures (Annual stool testing (i.e. hemoccult, FIT,                         cologuard), sigmoidoscopy, colonoscopy or CT                         colonography). You should share this recommendation                         with your Primary Care provider.                        - Return to GI office PRN.                        - The findings and  recommendations were discussed with                         the patient. Procedure Code(s):     --- Professional ---                        [phone], Colonoscopy, flexible; diagnostic, including                         collection of specimen(s) by brushing or washing, when                         performed (separate procedure) Diagnosis Code(s):     --- Professional ---                        K57.30, Diverticulosis of large intestine without                         perforation or abscess without bleeding                        R19.5, Other fecal abnormalities                        K64.0, First degree hemorrhoids CPT copyright 2022 American [employer] Association. All rights reserved. The codes documented in this report are preliminary and upon coder review may  be revised to meet current compliance requirements. Efrain Sella MD, MD 06/12/2022 11:39:54 AM This report has been signed electronically. Number of Addenda: 0 Note Initiated On: 06/12/2022 11:00 AM Scope Withdrawal Time: 0 hours 6 minutes 9 seconds  Total Procedure Duration: 0 hours 8 minutes 56 seconds  Estimated Blood Loss:  Estimated blood loss: none.      [facility]

## 2022-06-12 NOTE — H&P (Signed)
Outpatient short stay form Pre-procedure 06/12/2022 11:06 AM Ecko Beasley K. Alice Reichert, M.D.  Primary Physician: Sallee Lange, NP  Reason for visit:  Heme Positive Stool  History of present illness:  Patient presents for colonoscopy for finding of a hemoccult positive stool. Patient denies change in bowel habits, rectal bleeding, weight loss or abdominal pain.   Ms. Laska reports she is well. States she has some mild reflux that is not problematic. Has noted some irregublar bowel habits lately which are a change for her- ie shape/length/consistency/frequency. Denies abdominal pain, dyspepsia, NVD, problems swallowing, melena/hematochezia, unplanned loss of weight, and all other GI related complaints. Denies any family history of colorectal cancer/colon polyps. + FIT 11/23 Had some difficult with waking from sedation in past  Last colonoscopy: 26 years ago- states it was the worst experience of her life due to the prep and that it triggered a kidney stone at the time    Current Facility-Administered Medications:    0.9 %  sodium chloride infusion, , Intravenous, Continuous, Trevione Wert, Benay Pike, MD, Last Rate: 20 mL/hr at 06/12/22 1035, New Bag at 06/12/22 1035  Medications Prior to Admission  Medication Sig Dispense Refill Last Dose   benazepril (LOTENSIN) 20 MG tablet Take 20 mg by mouth 2 (two) times daily.   06/12/2022 at 0600   escitalopram (LEXAPRO) 20 MG tablet Take 20 mg by mouth daily.   06/12/2022 at 0600   spironolactone (ALDACTONE) 25 MG tablet Take 25 mg by mouth 2 (two) times daily.   06/12/2022 at 0700   traZODone (DESYREL) 50 MG tablet Take 50 mg by mouth at bedtime as needed for sleep.      acetaminophen (TYLENOL) 650 MG CR tablet Take 650 mg by mouth every 8 (eight) hours as needed for pain.      AZO-CRANBERRY PO Take by mouth at bedtime.      Cholecalciferol (VITAMIN D3) 10 MCG (400 UNIT) CAPS Take 50 mcg by mouth daily.      fluticasone (FLONASE) 50 MCG/ACT nasal spray  Place 2 sprays into both nostrils daily.      Multiple Vitamin (MULTIVITAMIN) tablet Take 1 tablet by mouth daily. Balance of Nature Fruit Capsule (3 per day) Balance of Nature Veggie Capsule (3 per Day)      vitamin B-12 (CYANOCOBALAMIN) 1000 MCG tablet Take 5,000 mcg by mouth daily.        No Known Allergies   Past Medical History:  Diagnosis Date   Allergies    Anxiety    Arthritis    B12 deficiency    Chronic kidney disease    followed due to low function   Chronic low back pain    Complication of anesthesia    slow to wake   GERD (gastroesophageal reflux disease)    Gout    Heart murmur    Hypertension    Subdural hematoma ([place]) 05/18/2020   After fall.  Resolved    Review of systems:  Otherwise negative.    Physical Exam  Gen: Alert, oriented. Appears stated age.  HEENT: [place]/AT. PERRLA. Lungs: CTA, no wheezes. CV: RR nl S1, S2. Abd: soft, benign, no masses. BS+ Ext: No edema. Pulses 2+    Planned procedures: Proceed with colonoscopy. The patient understands the nature of the planned procedure, indications, risks, alternatives and potential complications including but not limited to bleeding, infection, perforation, damage to internal organs and possible oversedation/side effects from anesthesia. The patient agrees and gives consent to proceed.  Please refer to procedure  notes for findings, recommendations and patient disposition/instructions.     Tequila Rottmann K. Alice Reichert, M.D. Gastroenterology 06/12/2022  11:06 AM

## 2022-06-12 NOTE — Anesthesia Preprocedure Evaluation (Signed)
Anesthesia Evaluation  Patient identified by MRN, date of birth, ID band Patient awake    Reviewed: Allergy & Precautions, NPO status , Patient's Chart, lab work & pertinent test results  Airway Mallampati: III  TM Distance: >3 FB Neck ROM: Full    Dental  (+) Teeth Intact   Pulmonary neg pulmonary ROS   Pulmonary exam normal breath sounds clear to auscultation       Cardiovascular Exercise Tolerance: Good hypertension, Pt. on medications negative cardio ROS Normal cardiovascular exam Rhythm:Regular Rate:Normal     Neuro/Psych   Anxiety     negative neurological ROS  negative psych ROS   GI/Hepatic negative GI ROS, Neg liver ROS,GERD  Medicated,,  Endo/Other  negative endocrine ROS    Renal/GU CRFRenal diseasenegative Renal ROS  negative genitourinary   Musculoskeletal  (+) Arthritis ,    Abdominal  (+) + obese  Peds negative pediatric ROS (+)  Hematology negative hematology ROS (+)   Anesthesia Other Findings Past Medical History: No date: Allergies No date: Anxiety No date: Arthritis No date: B12 deficiency No date: Chronic kidney disease     Comment:  followed due to low function No date: Chronic low back pain No date: Complication of anesthesia     Comment:  slow to wake No date: GERD (gastroesophageal reflux disease) No date: Gout No date: Heart murmur No date: Hypertension 05/18/2020: Subdural hematoma ([facility])     Comment:  After fall.  Resolved  Past Surgical History: No date: APPENDECTOMY 06/14/2019: CATARACT EXTRACTION W/PHACO; Left     Comment:  Procedure: CATARACT EXTRACTION PHACO AND INTRAOCULAR               LENS PLACEMENT ([place]) LEFT;  Surgeon: Eulogio Bear,               MD;  Location: [facility];  Service:               Ophthalmology;  Laterality: Left;  CDE 6.06 [place] 0:48.5 09/10/2021: CATARACT EXTRACTION W/PHACO; Right     Comment:  Procedure: CATARACT EXTRACTION PHACO  AND INTRAOCULAR               LENS PLACEMENT ([facility]) RIGHT 6.21 00:44.1;  Surgeon: Eulogio Bear, MD;  Location: [facility];                Service: Ophthalmology;  Laterality: Right; No date: COLONOSCOPY  BMI    Body Mass Index: 32.12 kg/m      Reproductive/Obstetrics negative OB ROS                             Anesthesia Physical Anesthesia Plan  ASA: 3  Anesthesia Plan: General   Post-op Pain Management:    Induction: Intravenous  PONV Risk Score and Plan: Propofol infusion and TIVA  Airway Management Planned: Natural Airway and Nasal Cannula  Additional Equipment:   Intra-op Plan:   Post-operative Plan:   Informed Consent: I have reviewed the patients History and Physical, chart, labs and discussed the procedure including the risks, benefits and alternatives for the proposed anesthesia with the patient or authorized representative who has indicated his/her understanding and acceptance.     Dental Advisory Given  Plan Discussed with: CRNA and Surgeon  Anesthesia Plan Comments:        Anesthesia Quick Evaluation

## 2022-06-12 NOTE — Interval H&P Note (Signed)
History and Physical Interval Note:  06/12/2022 11:07 AM  Tricia Price  has presented today for surgery, with the diagnosis of HEME(+) STOOL,BOWEL HABIT CHANGES.  The various methods of treatment have been discussed with the patient and family. After consideration of risks, benefits and other options for treatment, the patient has consented to  Procedure(s): COLONOSCOPY WITH PROPOFOL (N/A) as a surgical intervention.  The patient's history has been reviewed, patient examined, no change in status, stable for surgery.  I have reviewed the patient's chart and labs.  Questions were answered to the patient's satisfaction.     [place], [place]

## 2022-06-12 NOTE — Transfer of Care (Signed)
Immediate Anesthesia Transfer of Care Note  Patient: Tricia Price  Procedure(s) Performed: COLONOSCOPY WITH PROPOFOL  Patient Location: Endoscopy Unit  Anesthesia Type:General  Level of Consciousness: drowsy  Airway & Oxygen Therapy: Patient Spontanous Breathing  Post-op Assessment: Report given to RN and Post -op Vital signs reviewed and stable  Post vital signs: Reviewed and stable  Last Vitals:  Vitals Value Taken Time  BP    Temp    Pulse    Resp    SpO2      Last Pain:  Vitals:   06/12/22 1019  TempSrc: Temporal  PainSc: 0-No pain         Complications: No notable events documented.

## 2022-06-12 NOTE — Anesthesia Postprocedure Evaluation (Signed)
Anesthesia Post Note  Patient: Tricia Price  Procedure(s) Performed: COLONOSCOPY WITH PROPOFOL  Patient location during evaluation: PACU Anesthesia Type: General Level of consciousness: awake and alert Pain management: pain level controlled Respiratory status: spontaneous breathing and nonlabored ventilation Cardiovascular status: stable Anesthetic complications: no   No notable events documented.   Last Vitals:  Vitals:   06/12/22 1143 06/12/22 1153  BP: 96/62 123/78  Pulse: 63 67  Resp: 15 (!) 21  Temp: (!) 36 C   SpO2: 96% 98%    Last Pain:  Vitals:   06/12/22 1153  TempSrc:   PainSc: 0-No pain                 VAN STAVEREN,Jarelis Ehlert

## 2022-06-13 ENCOUNTER — Encounter: Payer: Self-pay | Admitting: Internal Medicine

## 2023-08-22 ENCOUNTER — Other Ambulatory Visit: Payer: Self-pay | Admitting: Orthopedic Surgery

## 2023-08-22 DIAGNOSIS — M51362 Other intervertebral disc degeneration, lumbar region with discogenic back pain and lower extremity pain: Secondary | ICD-10-CM

## 2023-08-22 DIAGNOSIS — M4807 Spinal stenosis, lumbosacral region: Secondary | ICD-10-CM

## 2023-08-22 DIAGNOSIS — G8929 Other chronic pain: Secondary | ICD-10-CM

## 2023-08-30 ENCOUNTER — Ambulatory Visit
Admission: RE | Admit: 2023-08-30 | Discharge: 2023-08-30 | Disposition: A | Discharge status: Home / Self Care | Source: Ambulatory Visit | Attending: Orthopedic Surgery | Admitting: Orthopedic Surgery

## 2023-08-30 DIAGNOSIS — G8929 Other chronic pain: Secondary | ICD-10-CM

## 2023-08-30 DIAGNOSIS — M51362 Other intervertebral disc degeneration, lumbar region with discogenic back pain and lower extremity pain: Secondary | ICD-10-CM

## 2023-08-30 DIAGNOSIS — M4807 Spinal stenosis, lumbosacral region: Secondary | ICD-10-CM

## 2023-11-17 IMAGING — US US EXTREM LOW VENOUS*L*
1 series · 14 of 24 positions shown · non-contrast
Comparison: None.

CLINICAL DATA: Pain, edema

EXAM:
LEFT LOWER EXTREMITY VENOUS DOPPLER ULTRASOUND
TECHNIQUE: Gray-scale sonography with compression, as well as color and duplex
ultrasound, were performed to evaluate the deep venous system(s)
from the level of the common femoral vein through the popliteal and
proximal calf veins.

[Series 1: us extrem low venous*left* · 0.08mm/px · 14 of 42 slices shown]
[im 1/42]
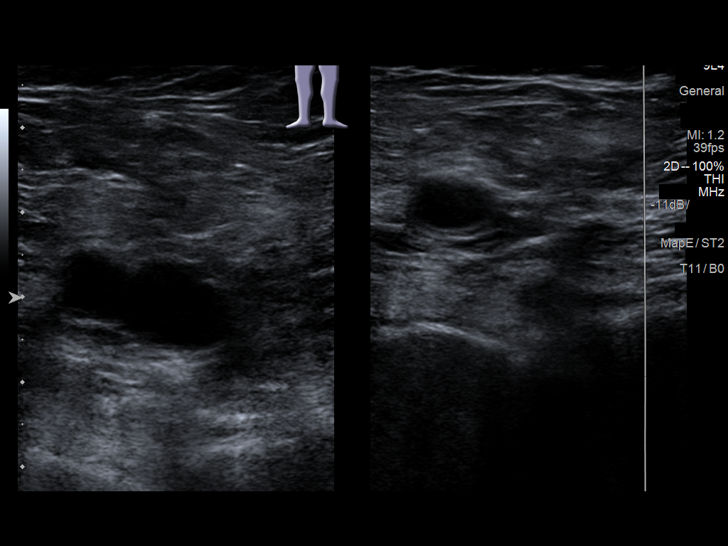
[im 4/42]
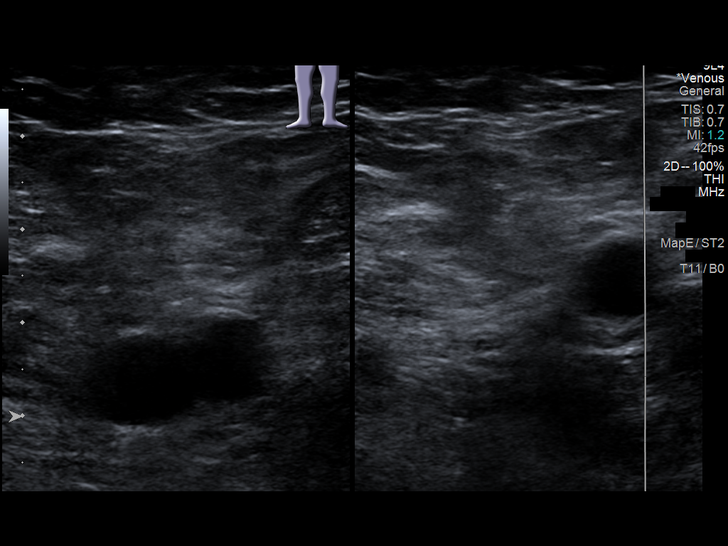
[im 8/42]
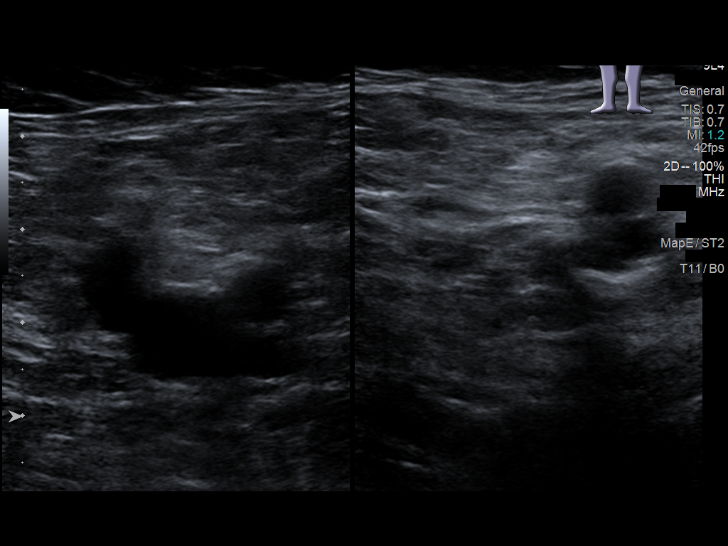
[im 11/42]
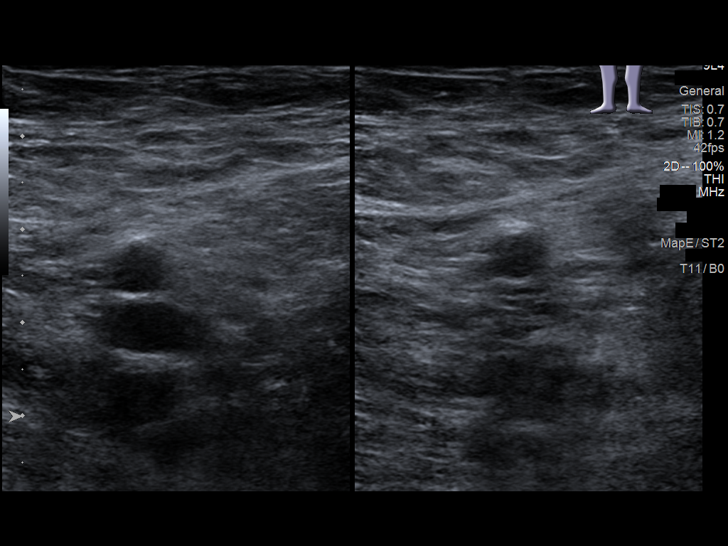
[im 13/42]
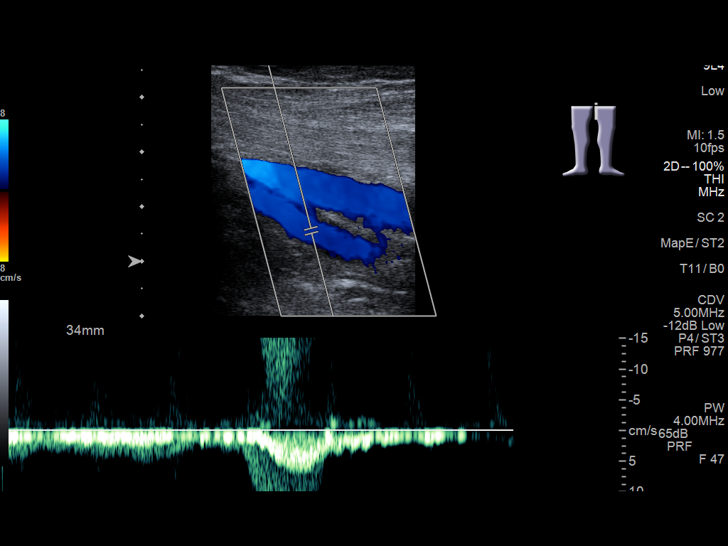
[im 17/42]
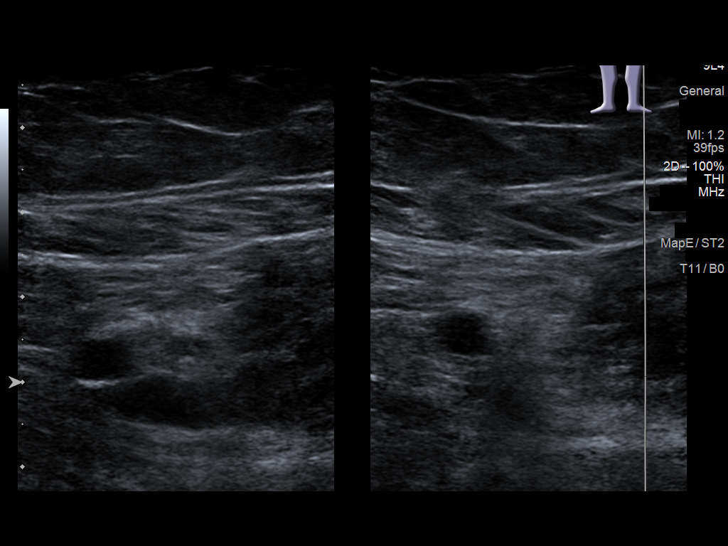
[im 20/42]
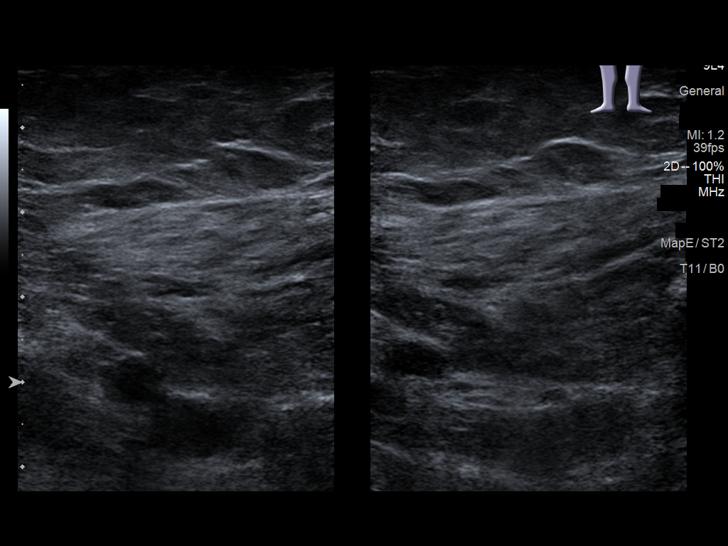
[im 22/42]
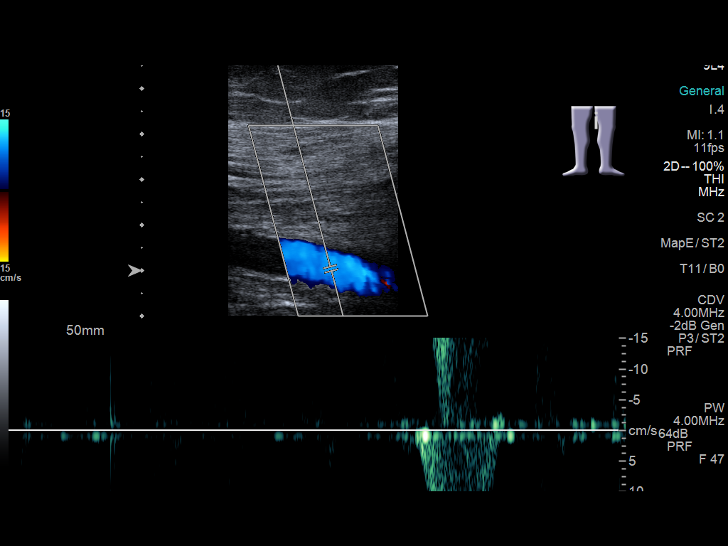
[im 25/42]
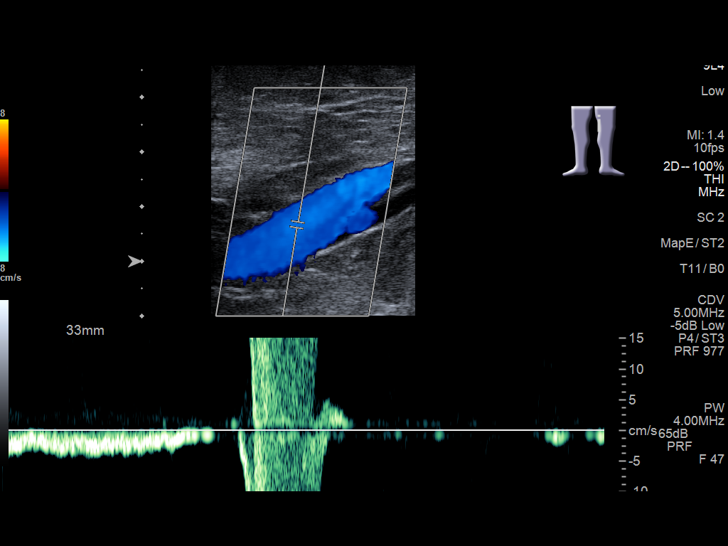
[im 29/42]
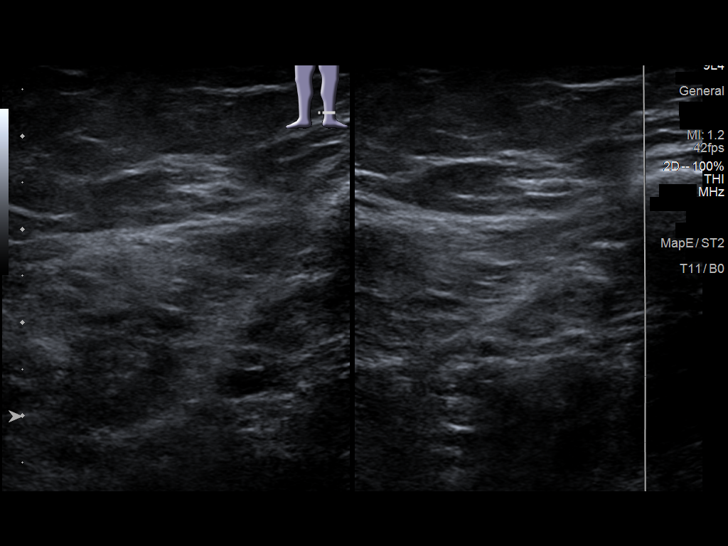
[im 33/42]
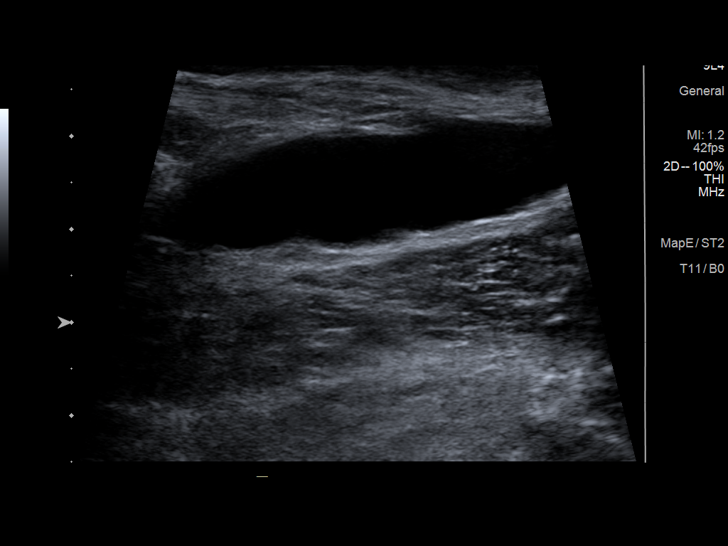
[im 34/42]
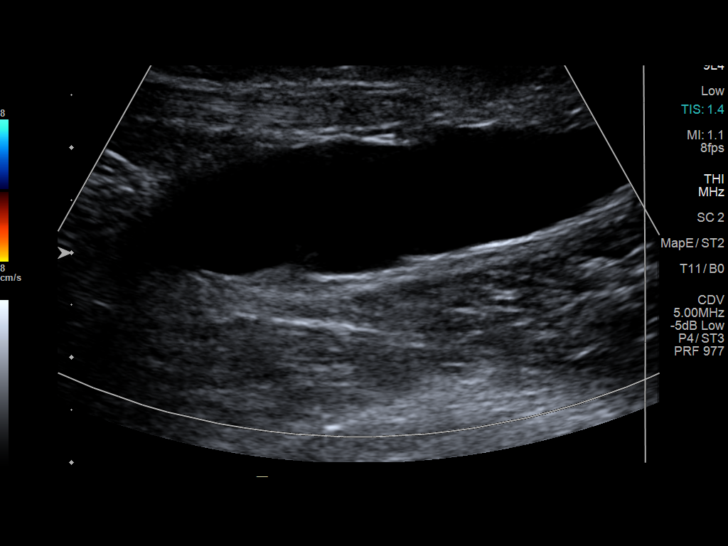
[im 38/42]
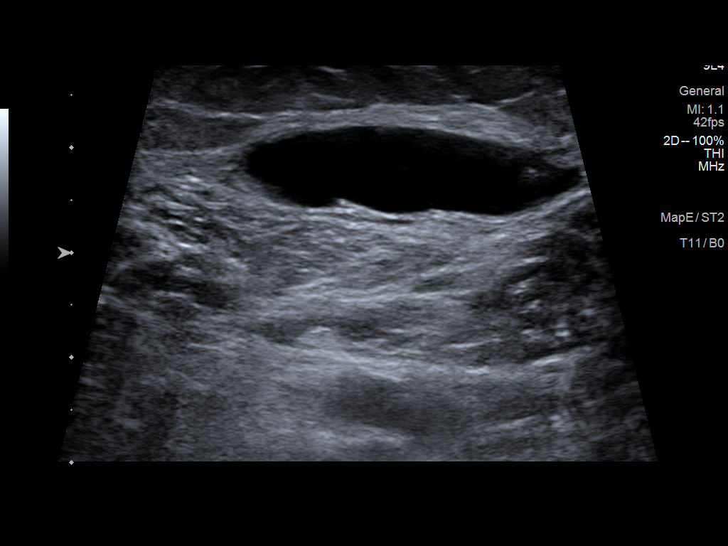
[im 42/42]
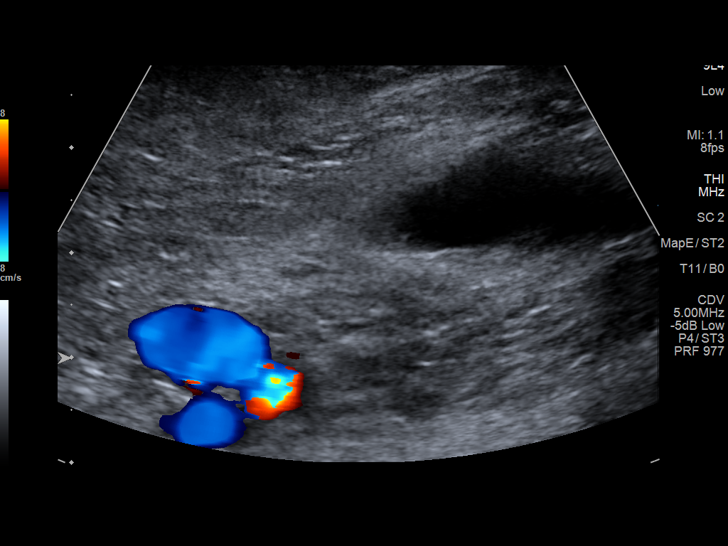

[14 of 24 positions shown; findings below may reference images not displayed]

FINDINGS: VENOUS

Normal compressibility of the common femoral, superficial femoral,
and popliteal veins, as well as the visualized calf veins.
Visualized portions of profunda femoral vein and great saphenous
vein unremarkable. No filling defects to suggest DVT on grayscale or
color Doppler imaging. Doppler waveforms show normal direction of
venous flow, normal respiratory phasicity and response to
augmentation.

Limited views of the contralateral common femoral vein are
unremarkable.

OTHER

Unilocular 4.7 x 1.4 x 3.2 cm cystic collection in the posterior
popliteal fossa.

Limitations: none
IMPRESSION: 1. Negative for left lower extremity DVT.
2. Left Baker's cyst.

## 2024-03-10 ENCOUNTER — Other Ambulatory Visit: Payer: Self-pay | Admitting: Nurse Practitioner

## 2024-03-10 DIAGNOSIS — Z1231 Encounter for screening mammogram for malignant neoplasm of breast: Secondary | ICD-10-CM

## 2024-04-05 ENCOUNTER — Other Ambulatory Visit: Payer: Self-pay | Admitting: Physical Medicine & Rehabilitation

## 2024-04-05 ENCOUNTER — Ambulatory Visit
Admission: RE | Admit: 2024-04-05 | Discharge: 2024-04-05 | Disposition: A | Source: Ambulatory Visit | Attending: Physical Medicine & Rehabilitation | Admitting: Physical Medicine & Rehabilitation

## 2024-04-05 DIAGNOSIS — M5416 Radiculopathy, lumbar region: Secondary | ICD-10-CM

## 2024-04-15 ENCOUNTER — Ambulatory Visit: Admission: RE | Admit: 2024-04-15

## 2024-04-15 DIAGNOSIS — Z1231 Encounter for screening mammogram for malignant neoplasm of breast: Secondary | ICD-10-CM | POA: Diagnosis present

## 2024-04-30 ENCOUNTER — Other Ambulatory Visit: Payer: Self-pay | Admitting: Nurse Practitioner

## 2024-04-30 DIAGNOSIS — R928 Other abnormal and inconclusive findings on diagnostic imaging of breast: Secondary | ICD-10-CM

## 2024-05-05 ENCOUNTER — Ambulatory Visit
Admission: RE | Admit: 2024-05-05 | Discharge: 2024-05-05 | Disposition: A | Discharge status: Home / Self Care | Source: Ambulatory Visit | Attending: Nurse Practitioner | Admitting: Nurse Practitioner

## 2024-05-05 DIAGNOSIS — R928 Other abnormal and inconclusive findings on diagnostic imaging of breast: Secondary | ICD-10-CM | POA: Diagnosis present
